# Patient Record
Sex: Male | Born: 1985 | Race: White | Hispanic: No | Marital: Single | Smoking: Current some day smoker
Health system: Southern US, Community
[De-identification: ages and names within clinical notes are randomized; demographics above are authoritative.]

## PROBLEM LIST (undated history)

## (undated) HISTORY — PX: NO PAST SURGERIES: SHX2092

---
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
## 2006-02-20 ENCOUNTER — Emergency Department: Payer: Self-pay | Admitting: Emergency Medicine

## 2006-09-14 IMAGING — CR RIGHT THUMB 2+V
1 series · 3 of 3 positions shown · non-contrast
Comparison: none

REASON FOR EXAM: Post reduction
COMMENTS:

[Series 1: view not recorded · 0.17mm/px · 3 of 3 slices shown]
[im 1/3]
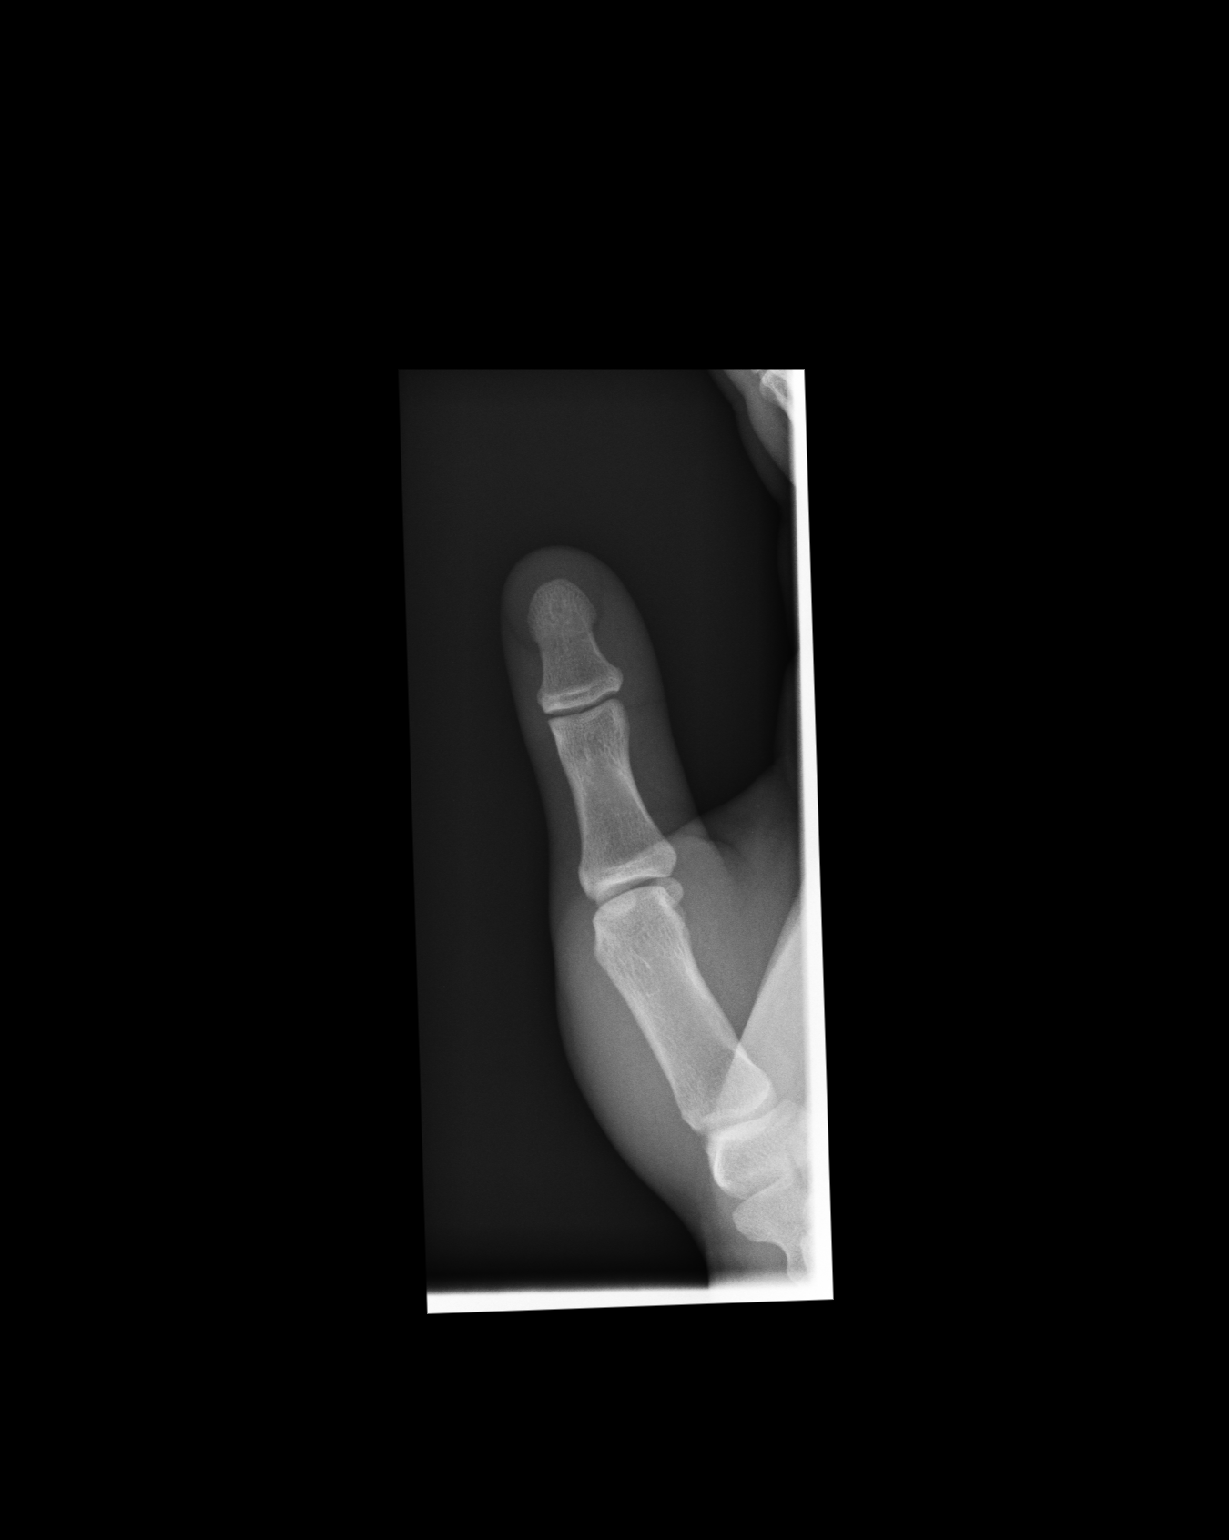
[im 2/3]
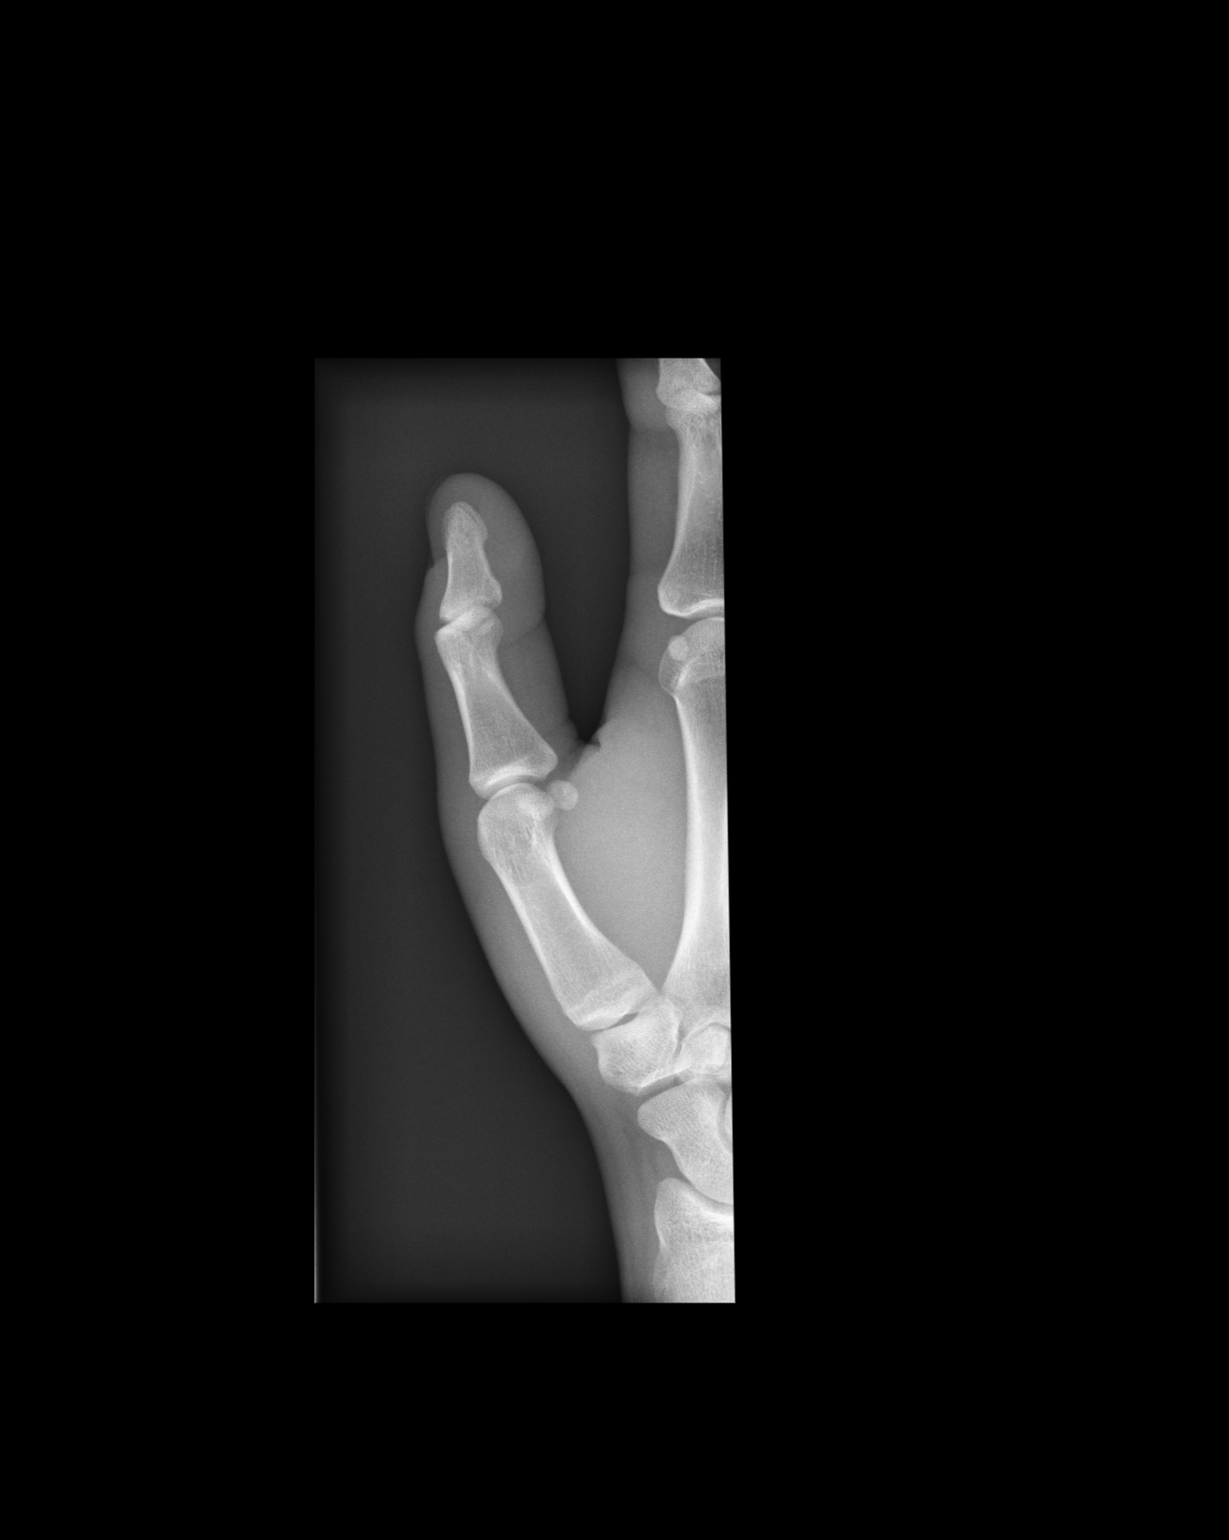
[im 3/3]
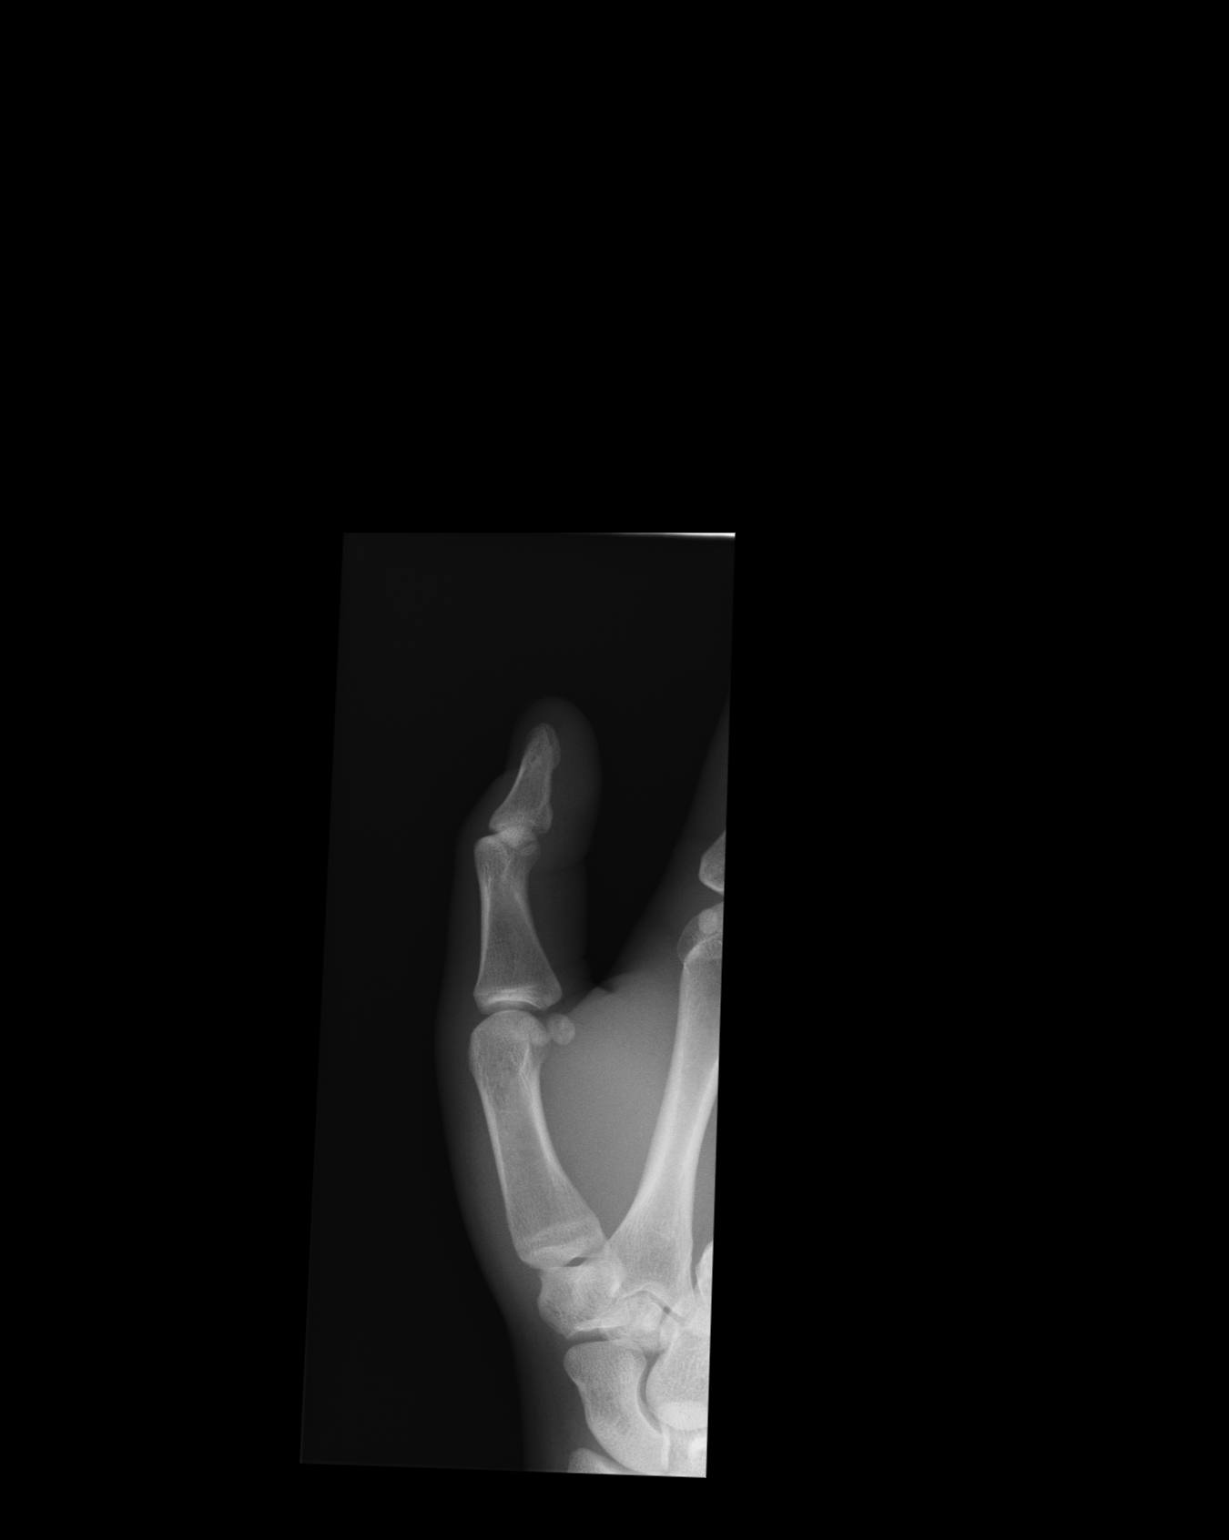

[3 of 3 positions shown; findings below may reference images not displayed]

PROCEDURE:     DXR - DXR THUMB RIGHT HAND (1ST DIGIT)  - September 26, 2005  [DATE]

RESULT:     The patient has sustained a prior dislocation of the first
metacarpophalangeal joint.  The patient underwent closed reduction.  On the
current study the bones are anatomic in alignment and no fracture is seen.
IMPRESSION: I do not see evidence of a fracture in this patient who has undergone closed
reduction of dislocation at the first metacarpophalangeal joint.

## 2006-09-14 IMAGING — CR RIGHT THUMB 2+V
1 series · 4 of 4 positions shown · non-contrast
Comparison: none

REASON FOR EXAM: pain- pt in waiting room
COMMENTS:  LMP: (Male)

[Series 1: view not recorded · 0.17mm/px · 4 of 4 slices shown]
[im 1/4]
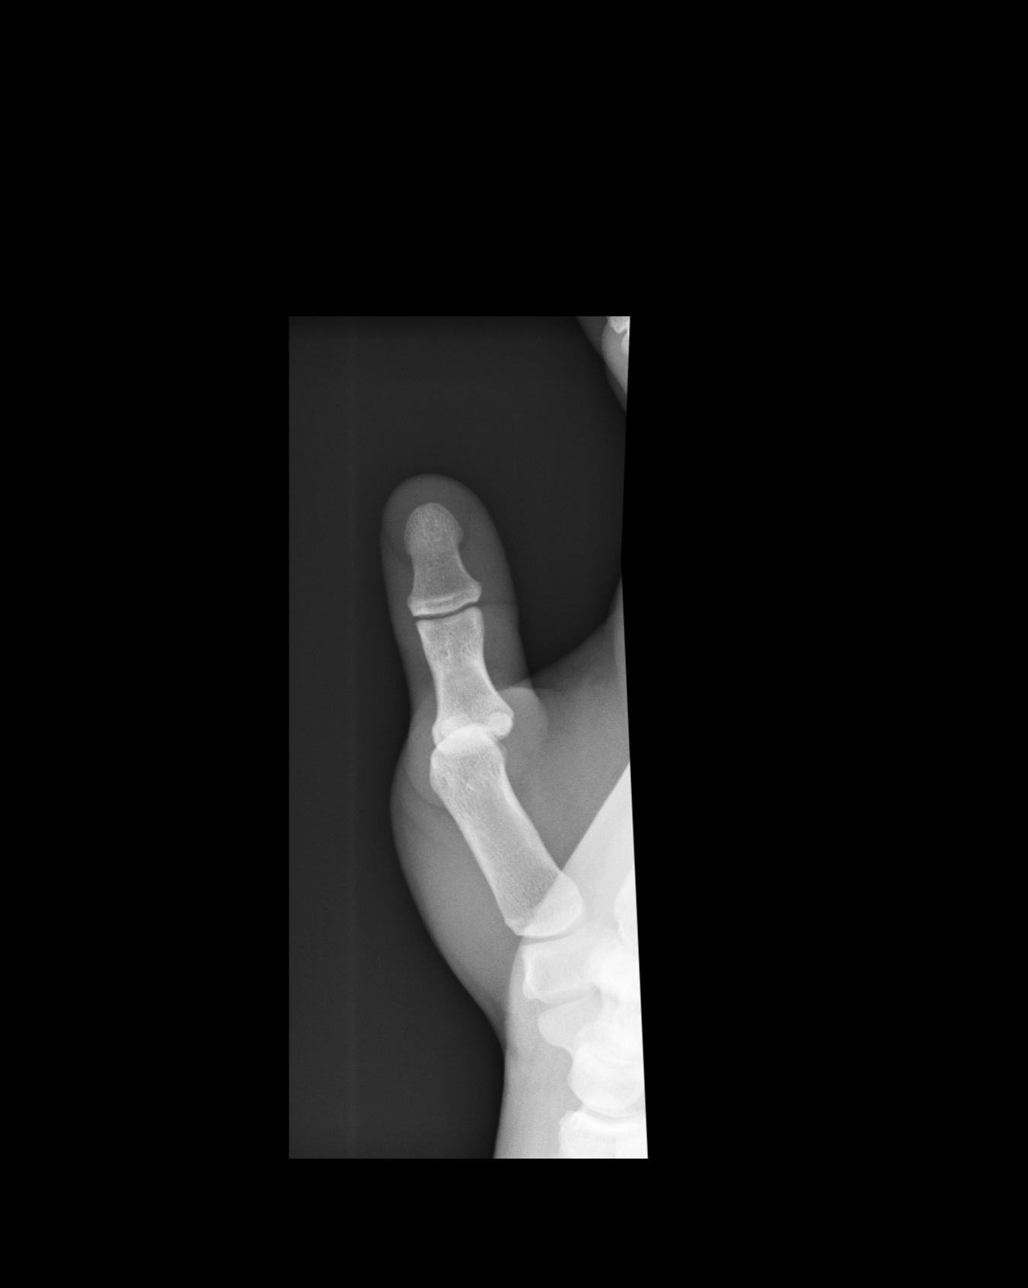
[im 2/4]
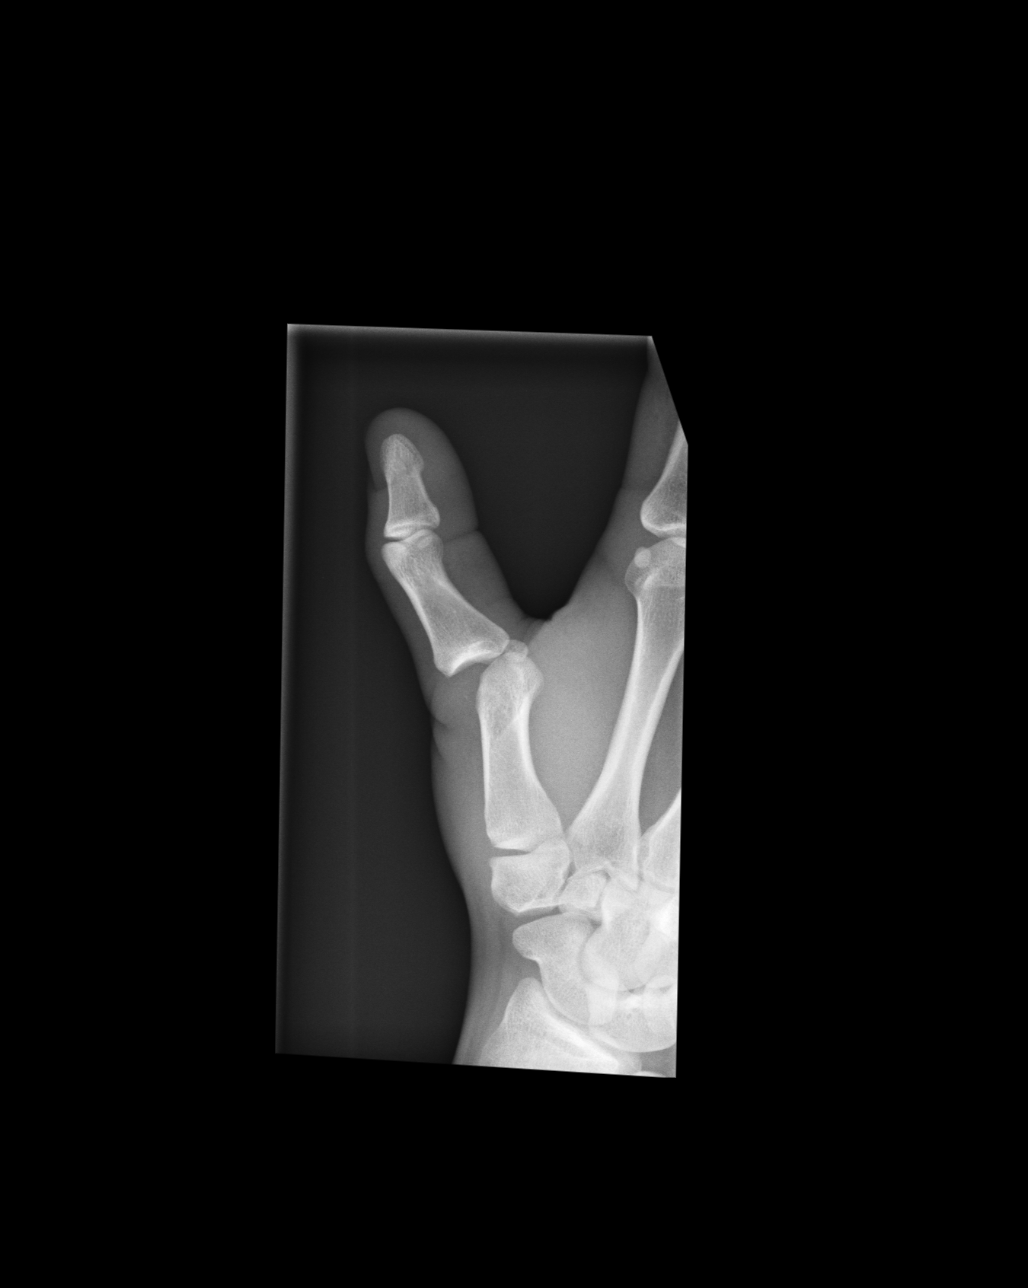
[im 3/4]
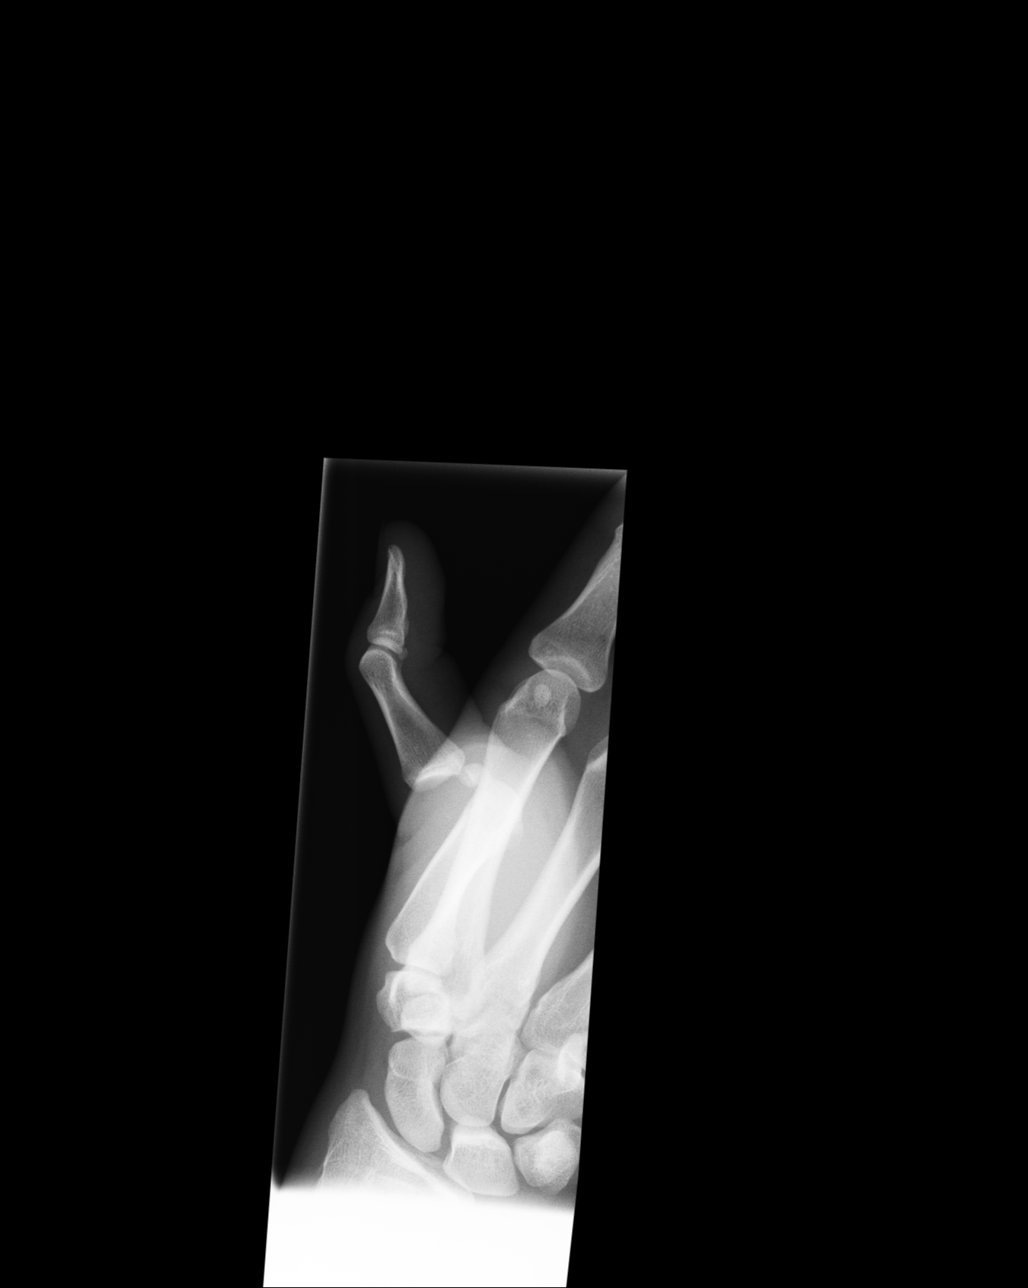
[im 4/4]
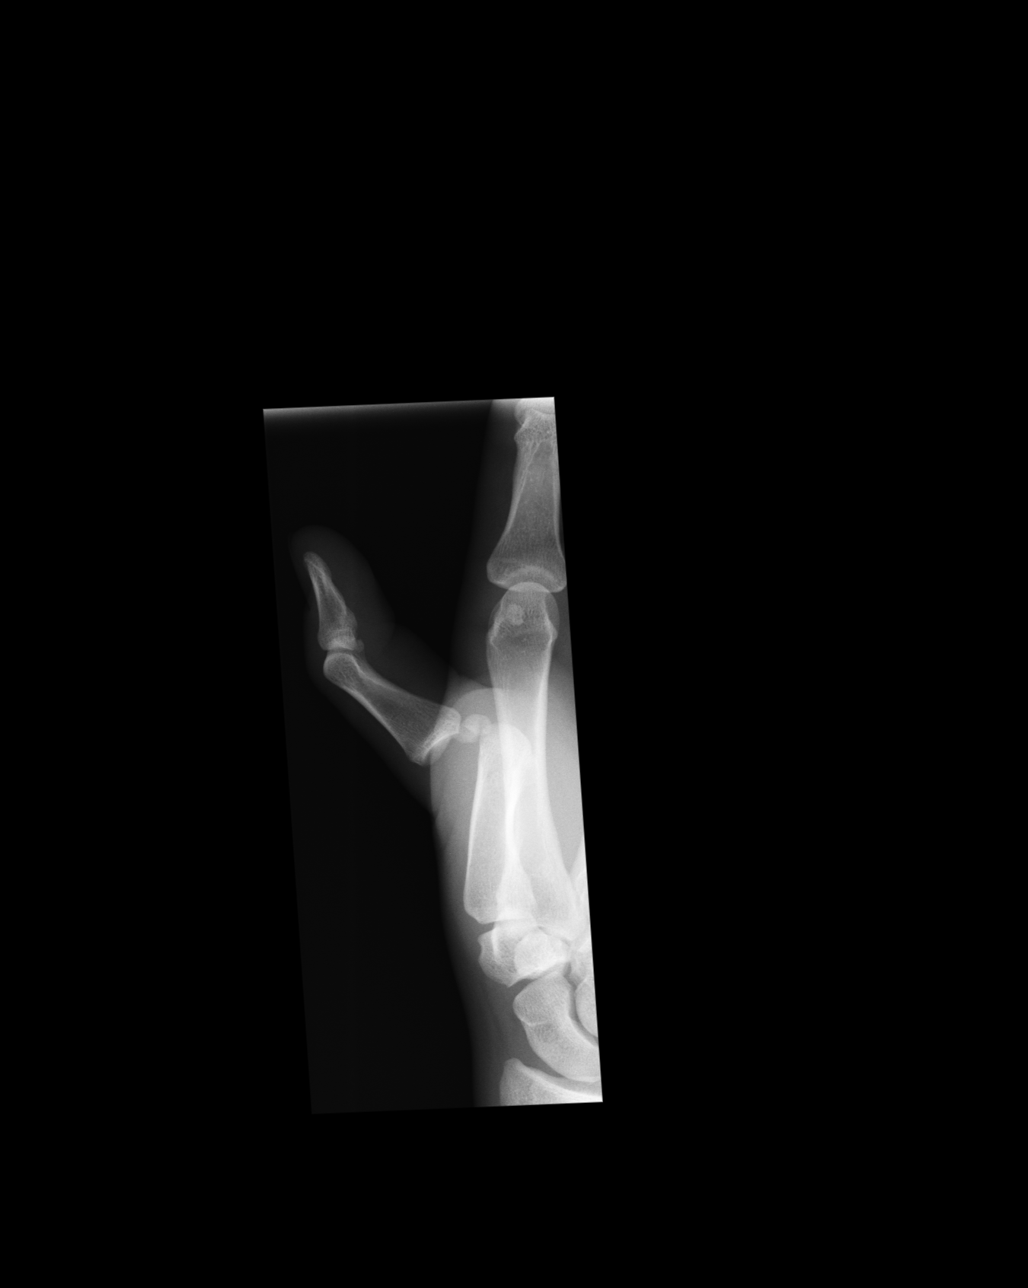

[4 of 4 positions shown; findings below may reference images not displayed]

PROCEDURE:     DXR - DXR THUMB RIGHT HAND (1ST DIGIT)  - September 26, 2005  [DATE]

RESULT:     The patient sustained dislocation of the RIGHT thumb at the
first metacarpophalangeal joint. I do not see definite evidence of an
underlying fracture. The phalanges have been displaced in a radial and
dorsal direction.
IMPRESSION: 1)The patient sustained dislocation of the first metacarpophalangeal joint.
No fracture is demonstrated.

## 2007-07-04 ENCOUNTER — Emergency Department: Payer: Self-pay | Admitting: Emergency Medicine

## 2014-03-08 ENCOUNTER — Ambulatory Visit: Payer: Self-pay | Admitting: Physician Assistant

## 2014-03-08 LAB — RAPID INFLUENZA A&B ANTIGENS

## 2015-05-05 ENCOUNTER — Telehealth: Payer: Self-pay | Admitting: Family Medicine

## 2015-05-05 NOTE — Telephone Encounter (Signed)
Pt returning call.  [phone]/[id number]#[phone]/MJ

## 2015-05-07 NOTE — Telephone Encounter (Signed)
Contacted patient back after advising of labs, he had questions in referance to OTC test to screen for UTI. Advised patient to schedule office visit if having symptoms or has urinary concerns.

## 2015-05-15 ENCOUNTER — Encounter: Payer: Self-pay | Admitting: Family Medicine

## 2015-05-15 ENCOUNTER — Ambulatory Visit (INDEPENDENT_AMBULATORY_CARE_PROVIDER_SITE_OTHER): Payer: BLUE CROSS/BLUE SHIELD | Admitting: Family Medicine

## 2015-05-15 VITALS — BP 122/72 | HR 64 | Temp 98.1°F | Resp 16 | Ht 74.0 in | Wt 206.6 lb

## 2015-05-15 DIAGNOSIS — Z719 Counseling, unspecified: Secondary | ICD-10-CM

## 2015-05-15 NOTE — Patient Instructions (Addendum)
Discussed that he was not the cause of her yeast infections. Consider latex allergy, secondary diagnosis being missed by her provider, and her use of cotton underwear

## 2015-05-15 NOTE — Progress Notes (Signed)
Subjective:     Patient ID: Ethan Villanueva, male   DOB: 1986/02/02, 29 y.o.   MRN: [id number]  HPI  Chief Complaint  Patient presents with  . Advice Only    Patient comes in office today to address concerns in his relationship with longtime girlfriend. Patient states that his girlfiend lives in [place] and everytime he goes to visit her that she contracts a yeast infection. Patient would like to discuss prescautions he can take so it is not transmitted to him and how to prevent re-occuring infections from happening when they are intimate.   Discussed having his girlfriend checked for diabetes, consider missed secondary diagnosis esp. Latex allergy from condoms.   Review of Systems     Objective:   Physical Exam deferred    Assessment:     1. Encounter for consultation     Plan:     No charge

## 2015-08-04 ENCOUNTER — Ambulatory Visit (INDEPENDENT_AMBULATORY_CARE_PROVIDER_SITE_OTHER): Payer: BLUE CROSS/BLUE SHIELD | Admitting: Family Medicine

## 2015-08-04 ENCOUNTER — Encounter: Payer: Self-pay | Admitting: Family Medicine

## 2015-08-04 VITALS — BP 116/70 | HR 92 | Temp 99.7°F | Resp 16 | Wt 200.4 lb

## 2015-08-04 DIAGNOSIS — J039 Acute tonsillitis, unspecified: Secondary | ICD-10-CM

## 2015-08-04 LAB — POCT RAPID STREP A (OFFICE): Rapid Strep A Screen: NEGATIVE

## 2015-08-04 MED ORDER — AMOXICILLIN 875 MG PO TABS: 875.0000 mg | ORAL_TABLET | Freq: Two times a day (BID) | ORAL | Status: AC

## 2015-08-04 NOTE — Progress Notes (Signed)
Subjective:     Patient ID: Ethan Villanueva, male   DOB: [birth year], 29 y.o.   MRN: [id number]  HPI  Chief Complaint  Patient presents with  . Sore Throat    Patient comes in office today with complaints of sore throat since Sun. 9/4. Patient states that he has been running a fever and having body/muscle aches, he has notices that he has swelling of lymph node on left side and pain when swallowing only on the left.   Denies exposure to strep or hx of mono.States he does see his young nieces and nephews at times.   Review of Systems     Objective:   Physical Exam  Constitutional: He appears well-developed and well-nourished. No distress.   Ears: T.M's intact without inflammation Throat: marked tonsillar enlargement with left tonsillar exudate Neck: enlarged, tender, left anterior cervical node. Lungs: clear     Assessment:    1. Tonsillitis - POCT rapid strep A - amoxicillin (AMOXIL) 875 MG tablet; Take 1 tablet (875 mg total) by mouth 2 (two) times daily.  Dispense: 20 tablet; Refill: 0    Plan:    Cover for strep; discussed EBV antibody titer if not improving.

## 2015-08-04 NOTE — Patient Instructions (Signed)
Discussed use of ibuprofen or Advil along with salt water gargles.
